# Patient Record
Sex: Female | Born: 1975 | Hispanic: No | Smoking: Current some day smoker
Health system: Southern US, Community
[De-identification: ages and names within clinical notes are randomized; demographics above are authoritative.]

## PROBLEM LIST (undated history)

## (undated) DIAGNOSIS — K219 Gastro-esophageal reflux disease without esophagitis: Secondary | ICD-10-CM

## (undated) HISTORY — PX: OTHER SURGICAL HISTORY: SHX169

## (undated) HISTORY — DX: Gastro-esophageal reflux disease without esophagitis: K21.9

## (undated) HISTORY — PX: WISDOM TOOTH EXTRACTION: SHX21

---

## 1997-03-01 ENCOUNTER — Encounter: Admission: RE | Admit: 1997-03-01 | Discharge: 1997-04-12 | Payer: Self-pay | Admitting: Obstetrics and Gynecology

## 1998-07-08 ENCOUNTER — Ambulatory Visit (HOSPITAL_COMMUNITY): Admission: RE | Admit: 1998-07-08 | Discharge: 1998-07-08 | Payer: Self-pay | Admitting: Obstetrics and Gynecology

## 1998-07-08 ENCOUNTER — Encounter: Payer: Self-pay | Admitting: Obstetrics and Gynecology

## 1998-12-09 ENCOUNTER — Encounter (HOSPITAL_COMMUNITY): Admission: RE | Admit: 1998-12-09 | Discharge: 1998-12-15 | Payer: Self-pay | Admitting: Obstetrics and Gynecology

## 1998-12-14 ENCOUNTER — Inpatient Hospital Stay (HOSPITAL_COMMUNITY): Admission: AD | Admit: 1998-12-14 | Discharge: 1998-12-16 | Payer: Self-pay | Admitting: Obstetrics and Gynecology

## 1999-09-27 ENCOUNTER — Other Ambulatory Visit: Admission: RE | Admit: 1999-09-27 | Discharge: 1999-09-27 | Payer: Self-pay | Admitting: Obstetrics and Gynecology

## 2001-06-10 ENCOUNTER — Other Ambulatory Visit: Admission: RE | Admit: 2001-06-10 | Discharge: 2001-06-10 | Payer: Self-pay | Admitting: Obstetrics and Gynecology

## 2002-09-16 ENCOUNTER — Other Ambulatory Visit: Admission: RE | Admit: 2002-09-16 | Discharge: 2002-09-16 | Payer: Self-pay | Admitting: Obstetrics and Gynecology

## 2003-10-13 ENCOUNTER — Other Ambulatory Visit: Admission: RE | Admit: 2003-10-13 | Discharge: 2003-10-13 | Payer: Self-pay | Admitting: Internal Medicine

## 2004-01-26 ENCOUNTER — Ambulatory Visit: Payer: Self-pay | Admitting: Internal Medicine

## 2004-03-06 ENCOUNTER — Ambulatory Visit: Payer: Self-pay | Admitting: Family Medicine

## 2004-10-18 ENCOUNTER — Other Ambulatory Visit: Admission: RE | Admit: 2004-10-18 | Discharge: 2004-10-18 | Payer: Self-pay | Admitting: Obstetrics and Gynecology

## 2005-04-11 ENCOUNTER — Ambulatory Visit: Payer: Self-pay | Admitting: Internal Medicine

## 2005-06-14 ENCOUNTER — Other Ambulatory Visit: Admission: RE | Admit: 2005-06-14 | Discharge: 2005-06-14 | Payer: Self-pay | Admitting: Obstetrics and Gynecology

## 2005-10-09 ENCOUNTER — Ambulatory Visit: Payer: Self-pay | Admitting: Internal Medicine

## 2006-08-30 ENCOUNTER — Ambulatory Visit: Payer: Self-pay | Admitting: Internal Medicine

## 2006-09-24 ENCOUNTER — Ambulatory Visit: Payer: Self-pay | Admitting: Internal Medicine

## 2006-09-24 DIAGNOSIS — R350 Frequency of micturition: Secondary | ICD-10-CM

## 2006-09-24 LAB — CONVERTED CEMR LAB
Bilirubin Urine: NEGATIVE
Glucose, Urine, Semiquant: NEGATIVE
Ketones, urine, test strip: NEGATIVE
Nitrite: POSITIVE
Protein, U semiquant: NEGATIVE
Specific Gravity, Urine: 1.01
Urobilinogen, UA: NEGATIVE
pH: 6.5

## 2006-10-01 ENCOUNTER — Encounter (INDEPENDENT_AMBULATORY_CARE_PROVIDER_SITE_OTHER): Payer: Self-pay | Admitting: *Deleted

## 2006-10-02 ENCOUNTER — Telehealth (INDEPENDENT_AMBULATORY_CARE_PROVIDER_SITE_OTHER): Payer: Self-pay | Admitting: *Deleted

## 2006-10-04 ENCOUNTER — Ambulatory Visit: Payer: Self-pay | Admitting: Internal Medicine

## 2006-10-08 ENCOUNTER — Encounter (INDEPENDENT_AMBULATORY_CARE_PROVIDER_SITE_OTHER): Payer: Self-pay | Admitting: *Deleted

## 2006-10-14 ENCOUNTER — Telehealth (INDEPENDENT_AMBULATORY_CARE_PROVIDER_SITE_OTHER): Payer: Self-pay | Admitting: *Deleted

## 2007-03-07 ENCOUNTER — Ambulatory Visit (HOSPITAL_COMMUNITY): Admission: RE | Admit: 2007-03-07 | Discharge: 2007-03-07 | Payer: Self-pay | Admitting: Obstetrics and Gynecology

## 2007-03-17 ENCOUNTER — Ambulatory Visit: Payer: Self-pay | Admitting: Internal Medicine

## 2007-03-17 DIAGNOSIS — J019 Acute sinusitis, unspecified: Secondary | ICD-10-CM

## 2007-04-15 ENCOUNTER — Telehealth (INDEPENDENT_AMBULATORY_CARE_PROVIDER_SITE_OTHER): Payer: Self-pay | Admitting: *Deleted

## 2007-04-16 ENCOUNTER — Ambulatory Visit: Payer: Self-pay | Admitting: Internal Medicine

## 2007-04-16 DIAGNOSIS — R498 Other voice and resonance disorders: Secondary | ICD-10-CM

## 2007-04-16 LAB — CONVERTED CEMR LAB: Rapid Strep: NEGATIVE

## 2007-04-19 LAB — CONVERTED CEMR LAB
Basophils Absolute: 0.1 10*3/uL (ref 0.0–0.1)
Basophils Relative: 1.4 % — ABNORMAL HIGH (ref 0.0–1.0)
Eosinophils Absolute: 0.1 10*3/uL (ref 0.0–0.6)
Eosinophils Relative: 1.9 % (ref 0.0–5.0)
Free T4: 0.9 ng/dL (ref 0.6–1.6)
HCT: 39.1 % (ref 36.0–46.0)
Hemoglobin: 12.8 g/dL (ref 12.0–15.0)
Lymphocytes Relative: 29.1 % (ref 12.0–46.0)
MCHC: 32.7 g/dL (ref 30.0–36.0)
MCV: 89.3 fL (ref 78.0–100.0)
Monocytes Absolute: 0.7 10*3/uL (ref 0.2–0.7)
Monocytes Relative: 14.7 % — ABNORMAL HIGH (ref 3.0–11.0)
Neutro Abs: 2.5 10*3/uL (ref 1.4–7.7)
Neutrophils Relative %: 52.9 % (ref 43.0–77.0)
Platelets: 224 10*3/uL (ref 150–400)
RBC: 4.38 M/uL (ref 3.87–5.11)
RDW: 14.2 % (ref 11.5–14.6)
TSH: 1.08 microintl units/mL (ref 0.35–5.50)
WBC: 4.8 10*3/uL (ref 4.5–10.5)

## 2007-04-21 ENCOUNTER — Encounter (INDEPENDENT_AMBULATORY_CARE_PROVIDER_SITE_OTHER): Payer: Self-pay | Admitting: *Deleted

## 2007-04-24 ENCOUNTER — Encounter (INDEPENDENT_AMBULATORY_CARE_PROVIDER_SITE_OTHER): Payer: Self-pay | Admitting: *Deleted

## 2008-01-27 ENCOUNTER — Ambulatory Visit: Payer: Self-pay | Admitting: Family Medicine

## 2008-01-27 DIAGNOSIS — J069 Acute upper respiratory infection, unspecified: Secondary | ICD-10-CM | POA: Insufficient documentation

## 2008-01-27 LAB — CONVERTED CEMR LAB: Rapid Strep: NEGATIVE

## 2008-04-04 ENCOUNTER — Telehealth: Payer: Self-pay | Admitting: Internal Medicine

## 2008-08-02 IMAGING — RF DG HYSTEROGRAM
7 series · 7 of 7 positions shown · IV contrast (omnipaque)
Comparison: none

CLINICAL DATA: Status post Essure procedure for contraception.  Evaluate tubal patency.
 HYSTEROSALPINGOGRAM:
TECHNIQUE: Following cleansing of the cervix and vagina with Betadine, a hysterosalpingogram catheter was placed into the endocervical canal.  Hysterosalpingogram was then performed using Omnipaque 300 contrast.  The patient tolerated the examination without difficulty.

[Series 1: run · 1 of 1 slices shown (1 of 7)]
[im 1/1]
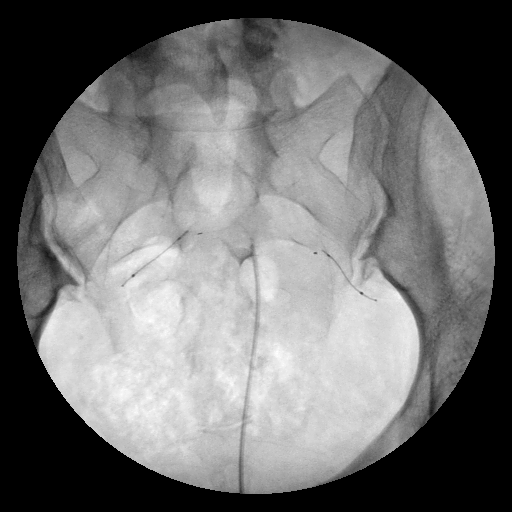

[Series 2: run · 1 of 1 slices shown (2 of 7)]
[im 1/1]
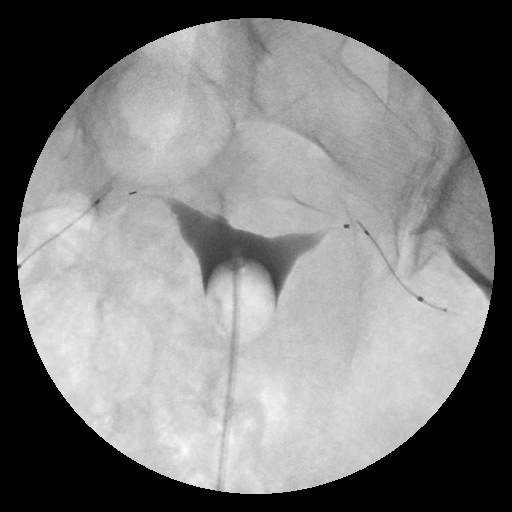

[Series 3: run · 1 of 1 slices shown (3 of 7)]
[im 1/1]
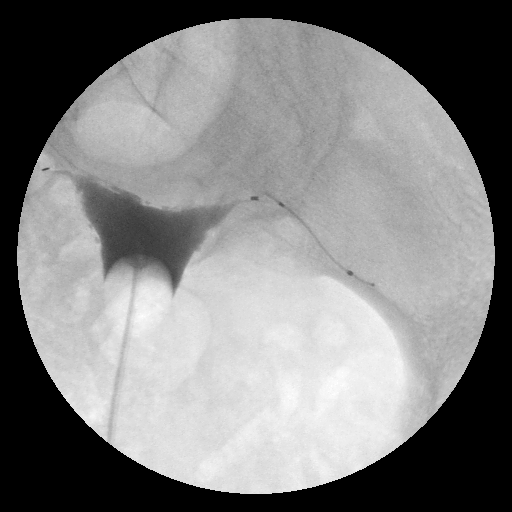

[Series 4: run · 1 of 1 slices shown (4 of 7)]
[im 1/1]
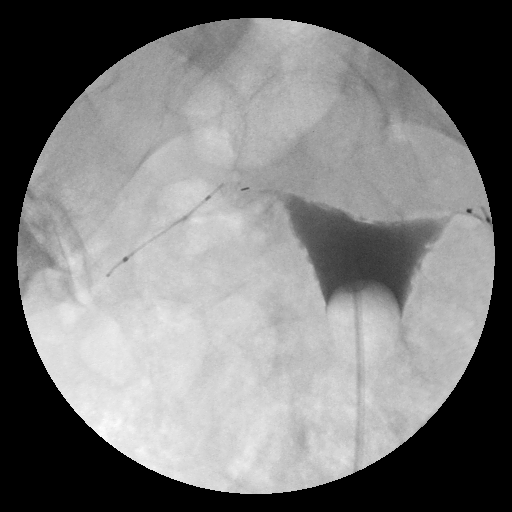

[Series 5: run · 1 of 1 slices shown (5 of 7)]
[im 1/1]
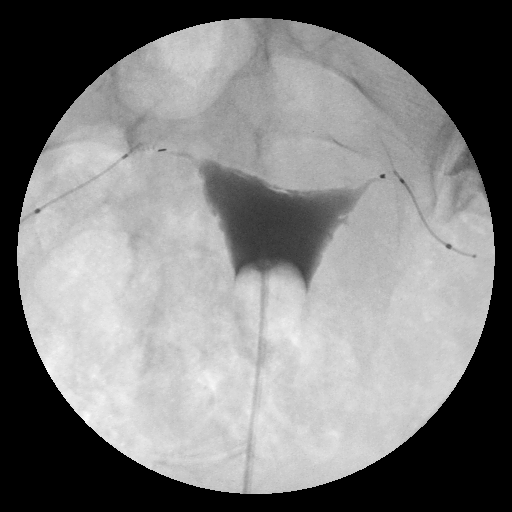

[Series 6: run · 1 of 1 slices shown (6 of 7)]
[im 1/1]
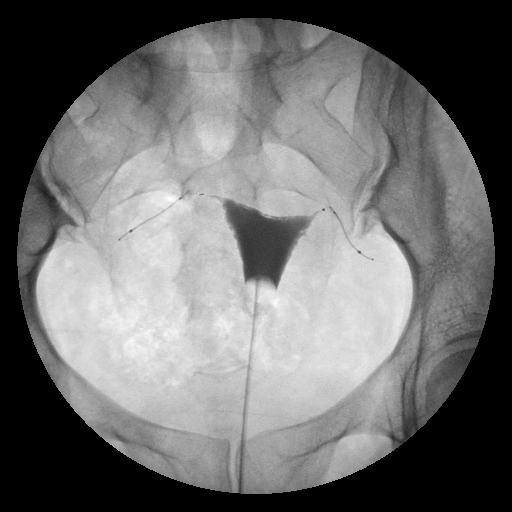

[Series 7: run · 1 of 1 slices shown (7 of 7)]
[im 1/1]
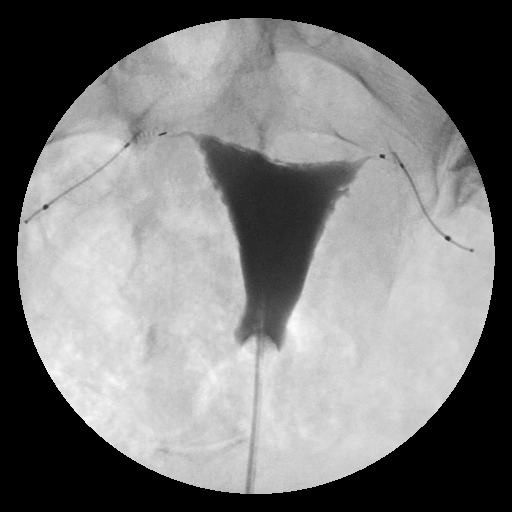

[7 of 7 positions shown; findings below may reference images not displayed]

FINDINGS: The endometrial cavity of the uterus is normal in appearance.
 Both Essure micro-inserts are in satisfactory location.   
 Both fallopian tubes are occluded at the cornua (category I).
IMPRESSION: Both Essure micro-inserts are in satisfactory location.   Both fallopian tubes are occluded at the cornua (category I).

## 2009-08-09 ENCOUNTER — Ambulatory Visit: Payer: Self-pay | Admitting: Internal Medicine

## 2009-08-09 DIAGNOSIS — K219 Gastro-esophageal reflux disease without esophagitis: Secondary | ICD-10-CM

## 2009-10-05 ENCOUNTER — Ambulatory Visit: Payer: Self-pay | Admitting: Internal Medicine

## 2009-10-05 LAB — CONVERTED CEMR LAB: Rapid Strep: NEGATIVE

## 2010-02-16 ENCOUNTER — Encounter (INDEPENDENT_AMBULATORY_CARE_PROVIDER_SITE_OTHER): Payer: Self-pay | Admitting: *Deleted

## 2010-02-16 ENCOUNTER — Telehealth (INDEPENDENT_AMBULATORY_CARE_PROVIDER_SITE_OTHER): Payer: Self-pay | Admitting: *Deleted

## 2010-02-19 ENCOUNTER — Encounter: Payer: Self-pay | Admitting: Obstetrics and Gynecology

## 2010-02-28 NOTE — Assessment & Plan Note (Signed)
Summary: ACID REFLUX/KN   Vital Signs:  Patient profile:   35 year old female Weight:      148.2 pounds Temp:     98.0 degrees F oral Pulse rate:   72 / minute Resp:     16 per minute BP sitting:   102 / 68  (left arm) Cuff size:   large  Vitals Entered By: Shonna Chock (August 09, 2009 10:55 AM) CC: ? acid reflux: Patient with burning sensation after eating, gets full reall qiuck and burping Comments REVIEWED MED LIST, PATIENT AGREED DOSE AND INSTRUCTION CORRECT    Primary Care Provider:  Marga Melnick MD  CC:  ? acid reflux: Patient with burning sensation after eating and gets full reall qiuck and burping.  History of Present Illness: Several months of dry heaving in am; poor nutrition, ie no breakfast but 40 oz of coffee / day & cokes. Smoking 1 pp week.No Rx to date.  Allergies (verified): No Known Drug Allergies  Review of Systems General:  Weight up 20# / 2 years. ENT:  Denies difficulty swallowing and hoarseness. GI:  Complains of indigestion, loss of appetite, and nausea; denies abdominal pain, bloody stools, and dark tarry stools.  Physical Exam  General:  well-nourished,in no acute distress; alert,appropriate and cooperative throughout examination Eyes:  No corneal or conjunctival inflammation noted. No icterus Mouth:  Oral mucosa and oropharynx without lesions or exudates.  Teeth in good repair.No pharyngeal erythema.   Lungs:  Normal respiratory effort, chest expands symmetrically. Lungs are clear to auscultation, no crackles or wheezes. Heart:  Normal rate and regular rhythm. S1 and S2 normal without gallop, murmur, click, rub or other extra sounds. Skin:  Intact without suspicious lesions or rashes. No jaundice Cervical Nodes:  No lymphadenopathy noted Axillary Nodes:  No palpable lymphadenopathy Psych:  memory intact for recent and remote, normally interactive, and good eye contact.     Impression & Recommendations:  Problem # 1:  GERD (ICD-530.81)  Her  updated medication list for this problem includes:    Omeprazole 20 Mg Cpdr (Omeprazole) .Marland Kitchen... 1 two times a day x 46month then 1 once daily pre b'fast  Complete Medication List: 1)  Mucinex  2)  Smz-tmp Ds 800-160 Mg Tabs (Sulfamethoxazole-trimethoprim) .Marland Kitchen.. 1 two times a day with 8 oz water 3)  Fluconazole 150 Mg Tabs (Fluconazole) .Marland Kitchen.. 1 once daily prn 4)  Azithromycin 250 Mg Tabs (Azithromycin) .... As per pack 5)  Asmanex 120 Metered Doses 220 Mcg/inh Aepb (Mometasone furoate) .... 2 inhalations q 12 hrs 6)  Clarinex 5 Mg Tabs (Desloratadine) .Marland Kitchen.. 1 by mouth once daily 7)  Omeprazole 20 Mg Cpdr (Omeprazole) .Marland Kitchen.. 1 two times a day x 46month then 1 once daily pre b'fast  Patient Instructions: 1)  Consume < 30 grams of sugar/ day from foods & drinks with High Fructose Corn Syrup  as #1,2 , or #3  on label.Avoid foods high in acid (tomatoes, citrus juices, spicy foods). Avoid eating within two hours of lying down or before exercising. Do not over eat; try smaller more frequent meals. Elevate head of bed twelve inches when sleeping. Prescriptions: OMEPRAZOLE 20 MG CPDR (OMEPRAZOLE) 1 two times a day X 46month then 1 once daily pre b'fast  #60 x 1   Entered and Authorized by:   Marga Melnick MD   Signed by:   Marga Melnick MD on 08/09/2009   Method used:   Print then Give to Patient   RxID:  [id number]  

## 2010-02-28 NOTE — Assessment & Plan Note (Signed)
Summary: sore throat./cbs   Vital Signs:  Patient profile:   35 year old female Weight:      154.2 pounds Temp:     98.5 degrees F oral Pulse rate:   72 / minute Resp:     16 per minute BP sitting:   120 / 78  (left arm) Cuff size:   large  Vitals Entered By: Shonna Chock CMA (October 05, 2009 3:18 PM) CC: Sore throat and head hurts, URI symptoms   Primary Care Provider:  Marga Melnick MD  CC:  Sore throat and head hurts and URI symptoms.  History of Present Illness:  URI Symptoms      This is a 35 year old woman who presents with URI symptoms since last night  The patient reports nasal congestion, purulent nasal discharge, and sore throat, but denies productive cough and earache.  The patient denies fever, dyspnea, and wheezing.  The patient also reports frontal  headache.  Risk factors for Strep sinusitis include  facial pain and tender adenopathy.  The patient denies the following risk factors for Strep sinusitis: tooth pain.  Rx: NSAIDS, tea.Smoking 4 cig/day.  Current Medications (verified): 1)  Omeprazole 20 Mg Cpdr (Omeprazole) .Marland Kitchen.. 1 Two Times A Day X 25month Then 1 Once Daily Pre B'fast  Allergies (verified): No Known Drug Allergies  Physical Exam  General:  well-nourished,in no acute distress; alert,appropriate and cooperative throughout examination Ears:  External ear exam shows no significant lesions or deformities.  Otoscopic examination reveals clear canals, tympanic membranes are intact bilaterally without bulging, retraction, inflammation or discharge. Hearing is grossly normal bilaterally. Nose:  External nasal examination shows no deformity or inflammation. Nasal mucosa are  mildly erythematous.Septal dislocation & deviation Mouth:  Oral mucosa and oropharynx without lesions or exudates.  Teeth in good repair. Lungs:  Normal respiratory effort, chest expands symmetrically. Lungs are clear to auscultation, no crackles or wheezes. Heart:  Normal rate and  regular rhythm. S1 and S2 normal without gallop, murmur, click, rub . Cervical Nodes:  L anterior LN tender.   Axillary Nodes:  No palpable lymphadenopathy   Impression & Recommendations:  Problem # 1:  SINUSITIS- ACUTE-NOS (ICD-461.9)  The following medications were removed from the medication list:    Smz-tmp Ds 800-160 Mg Tabs (Sulfamethoxazole-trimethoprim) .Marland Kitchen... 1 two times a day with 8 oz water    Azithromycin 250 Mg Tabs (Azithromycin) .Marland Kitchen... As per pack Her updated medication list for this problem includes:    Amoxicillin 500 Mg Caps (Amoxicillin) .Marland Kitchen... 1 three times a day  Complete Medication List: 1)  Omeprazole 20 Mg Cpdr (Omeprazole) .Marland Kitchen.. 1 two times a day x 25month then 1 once daily pre b'fast 2)  Amoxicillin 500 Mg Caps (Amoxicillin) .Marland Kitchen.. 1 three times a day 3)  Lorazepam 0.5 Mg Tabs (Lorazepam) .Marland Kitchen.. 1 every 8-12 hrs as needed  Other Orders: Rapid Strep ([id number])  Patient Instructions: 1)  Neti pot once daily until sinuses are clear.Plain Mucinex as needed if secretions are thick.Drink as much NON dairy  fluid as you can tolerate for the next few days. Prescriptions: LORAZEPAM 0.5 MG TABS (LORAZEPAM) 1 every 8-12 hrs as needed  #30 x 1   Entered and Authorized by:   Marga Melnick MD   Signed by:   Marga Melnick MD on 10/05/2009   Method used:   Print then Give to Patient   RxID:   [id number] AMOXICILLIN 500 MG CAPS (AMOXICILLIN) 1 three times a day  #30  x 0   Entered and Authorized by:   Marga Melnick MD   Signed by:   Marga Melnick MD on 10/05/2009   Method used:   Faxed to ...       [facility] [address] (retail)       [address]       [place], [place]  [address]       Ph: [phone] or [phone]       Fax: [phone]   RxID:   [id number]   Laboratory Results    Other Tests  Rapid Strep: negative

## 2010-03-02 NOTE — Progress Notes (Signed)
Summary: Refill Request  Phone Note Refill Request Message from:  Pharmacy on February 16, 2010 9:49 AM  Refills Requested: Medication #1:  LORAZEPAM 0.5 MG TABS 1 every 8-12 hrs as needed.   Dosage confirmed as above?Dosage Confirmed   Supply Requested: 30   [facility] [address]  Next Appointment Scheduled: none Initial call taken by: Harold Barban,  February 16, 2010 9:49 AM    Prescriptions: LORAZEPAM 0.5 MG TABS (LORAZEPAM) 1 every 8-12 hrs as needed  #30 x 0   Entered by:   Shonna Chock CMA   Authorized by:   Marga Melnick MD   Signed by:   Shonna Chock CMA on 02/16/2010   Method used:   Printed then faxed to ...       [facility] [address] (retail)       [address]       [place], [place]  [address]       Ph: [phone] or [phone]       Fax: [phone]   RxID:   [id number]  Patient needs to schedule a yearly follow-up

## 2010-03-02 NOTE — Letter (Signed)
Summary: Primary Care Appointment Letter  [facility]  [address] [place], [place] [address]   Phone: [phone]  Fax: [phone]    02/16/2010 MRN: [id number]  [facility] [address] [place], [place]  [address]  Dear Ms. Christy Anderson,   Your Primary Care Physician Marga Melnick MD has indicated that:    ____X___it is time to schedule an appointment (It is time for a yearly physical, this is necessary to continue refilling meds).    _______you missed your appointment on______ and need to call and          reschedule.    _______you need to have lab work done.    _______you need to schedule an appointment discuss lab or test results.    _______you need to call to reschedule your appointment that is                       scheduled on _________.     Please call our office as soon as possible. Our phone number is [phone]. Please press option 1. Our office is open 8a-5p, Monday through Friday.     Thank you,    [facility]

## 2010-04-03 ENCOUNTER — Telehealth: Payer: Self-pay | Admitting: Internal Medicine

## 2010-04-11 NOTE — Progress Notes (Signed)
Summary: letter for unemployment  Phone Note Call from Patient Call back at Work Phone [phone]   Caller: Patient Summary of Call: Pt says that she quit her job about 6 wks ago and would like to have letter written so she can have documentation that she was taking medication for stress. Since her unemployment was denied she will need to have letter written  Follow-up for Phone Call        To Who t May Concern: Phineas Douglas  has been on medication prescrbed  for stress related symptoms. Douglass Rivers MD Follow-up by: Marga Melnick MD,  April 03, 2010 4:23 PM  Additional Follow-up for Phone Call Additional follow up Details #1::        Letter typed and printed on letterhead. Additional Follow-up by: Barnie Mort,  April 05, 2010 1:00 PM    Additional Follow-up for Phone Call Additional follow up Details #2::    Left Pt detail message letter typed and ready for pick-up. Letter placed up front. Pt to call if she has any other concerns.............Marland KitchenFelecia Deloach CMA  April 05, 2010 1:07 PM

## 2010-06-05 ENCOUNTER — Telehealth: Payer: Self-pay | Admitting: *Deleted

## 2010-06-05 NOTE — Telephone Encounter (Signed)
Pt called would like to have letter stating date for which she was omeprazole for acid reflux which was 07/2009 and would like for letter to emailed to her.   Hop letter ok?

## 2010-06-05 NOTE — Telephone Encounter (Signed)
To Whom It May Concern: Omeprazole, a protein pump inhibitor, as initially prescribed for MS. Nolon Rod and July of 2011.  Douglass Rivers MD, FACP

## 2010-06-06 NOTE — Telephone Encounter (Signed)
Left msg for pt on voicemail letter completed.

## 2010-06-06 NOTE — Telephone Encounter (Deleted)
To Whom It May Concern: Omeprazole, a protein pump inhibitor, as initially prescribed for MS. Kynsley Jirak and July of 2011.  W. F. Hopper MD, FACP 

## 2010-08-22 ENCOUNTER — Telehealth: Payer: Self-pay | Admitting: Internal Medicine

## 2010-08-23 ENCOUNTER — Ambulatory Visit (INDEPENDENT_AMBULATORY_CARE_PROVIDER_SITE_OTHER): Payer: Self-pay | Admitting: Internal Medicine

## 2010-08-23 ENCOUNTER — Encounter: Payer: Self-pay | Admitting: Internal Medicine

## 2010-08-23 VITALS — BP 112/76 | HR 75 | Wt 144.8 lb

## 2010-08-23 DIAGNOSIS — R4589 Other symptoms and signs involving emotional state: Secondary | ICD-10-CM

## 2010-08-23 DIAGNOSIS — F411 Generalized anxiety disorder: Secondary | ICD-10-CM

## 2010-08-23 NOTE — Telephone Encounter (Signed)
I called patient (Per Dr. Frederik Pear Request) to find out details of call for Dr.Hopper had not recently spoke with patient.  Patient indicated she needs Dr.Hopper to write her a letter "Its kinda personal", schedule patient an appointment to see Dr.Hopper today

## 2010-08-23 NOTE — Patient Instructions (Signed)
If your appeal were denied, unfortunately a formal psychological evaluation may be required. Medically having seen you  for almost 2 decades, I do not believe that is necessary.

## 2010-08-23 NOTE — Progress Notes (Signed)
  Subjective:    Patient ID: Christy Anderson, female    DOB: Jan 29, 1976, 35 y.o.   MRN: [id number]  HPI She left her place of employment in mid-January; her unemployment benefits have been denied as of this date.  She worked at her place of employment for 4 years and had received exemplary job performance reviews. Financially  she  also performed an extremely high level.  The major issue was the stress in the workplace, particularly suboptimal interpersonal relationships with  supervisors.  She had taken alprazolam and subsequently lorazepam without significant  benefit. A significant stress for her was having take medicine to work in this environment.  She has had 3 children and going to divorce but has never had to take medications for anxiety or stress prior to employment at this location.    Review of Systems     Objective:   Physical Exam She appears healthy and well-nourished. Vital signs are stable and normal as documented.  She is oriented x3. She exhibits good logic and flow of ideas. Mini  mental status exam is normal to recall & problem solving. Cognition and judgment appear intact. Alert, communicative  and cooperative with normal attention span and concentration. No apparent delusions, illusions, hallucinations. Intermittently she becomes tearful as she discusses the stresses she has experienced in the workplace.          Assessment & Plan:  #1 anxiety, situational. The prior job situation was not a mentally and emotionally healthy environment for her despite the performance ratings & financial rewards she experienced. She is very intelligent and capable as those performance ratings have documented. In a nontoxic environment she should excel without need for medication

## 2012-05-08 ENCOUNTER — Encounter: Payer: Self-pay | Admitting: Internal Medicine

## 2012-05-20 ENCOUNTER — Encounter: Payer: Self-pay | Admitting: Internal Medicine

## 2012-05-20 ENCOUNTER — Ambulatory Visit (INDEPENDENT_AMBULATORY_CARE_PROVIDER_SITE_OTHER): Payer: Self-pay | Admitting: Internal Medicine

## 2012-05-20 VITALS — BP 110/78 | HR 106 | Temp 100.6°F | Wt 149.0 lb

## 2012-05-20 DIAGNOSIS — T3695XA Adverse effect of unspecified systemic antibiotic, initial encounter: Secondary | ICD-10-CM

## 2012-05-20 DIAGNOSIS — J020 Streptococcal pharyngitis: Secondary | ICD-10-CM

## 2012-05-20 DIAGNOSIS — B49 Unspecified mycosis: Secondary | ICD-10-CM

## 2012-05-20 MED ORDER — FLUCONAZOLE 150 MG PO TABS: 150.0000 mg | ORAL_TABLET | Freq: Once | ORAL | Status: DC

## 2012-05-20 MED ORDER — AMOXICILLIN 500 MG PO CAPS: 500.0000 mg | ORAL_CAPSULE | Freq: Three times a day (TID) | ORAL | Status: DC

## 2012-05-20 NOTE — Patient Instructions (Signed)

## 2012-05-21 ENCOUNTER — Encounter: Payer: Self-pay | Admitting: Internal Medicine

## 2012-05-21 NOTE — Addendum Note (Signed)
Addended by: Carin Primrose on: 05/21/2012 03:53 PM   Modules accepted: Orders

## 2012-05-21 NOTE — Progress Notes (Signed)
HPI  Pt presents to the clinic today with c/o sore throat x 4 days. She has also has fever and fatigue. She looked at her throat in the mirror and saw white patches in her throat. She has had strep throat in the past. She reports this feels the same. She has taken tylenol for the fever but not anything else. She has had sick contacts.  Review of Systems     History reviewed. No pertinent past medical history.  No family history on file.  History   Social History  . Marital Status: Divorced    Spouse Name: N/A    Number of Children: N/A  . Years of Education: N/A   Occupational History  . Not on file.   Social History Main Topics  . Smoking status: Current Some Day Smoker  . Smokeless tobacco: Not on file  . Alcohol Use: Yes  . Drug Use: No  . Sexually Active: Not on file   Other Topics Concern  . Not on file   Social History Narrative  . No narrative on file    No Known Allergies   Constitutional: Positive headache, fatigue and fever. Denies abrupt weight changes.  HEENT:  Positive sore throat. Denies eye redness, eye pain, pressure behind the eyes, facial pain, nasal congestion, ear pain, ringing in the ears, wax buildup, runny nose or bloody nose. Respiratory: Denies cough, difficulty breathing or shortness of breath.  Cardiovascular: Denies chest pain, chest tightness, palpitations or swelling in the hands or feet.   No other specific complaints in a complete review of systems (except as listed in HPI above).  Objective:   BP 110/78  Pulse 106  Temp(Src) 100.6 F (38.1 C) (Oral)  Wt 149 lb (67.586 kg)  SpO2 96%  LMP 05/03/2012 Wt Readings from Last 3 Encounters:  05/20/12 149 lb (67.586 kg)  08/23/10 144 lb 12.8 oz (65.681 kg)  10/05/09 154 lb 3.2 oz (69.945 kg)     General: Appears her stated age, well developed, well nourished in NAD. HEENT: Head: normal shape and size; Eyes: sclera white, no icterus, conjunctiva pink, PERRLA and EOMs intact; Ears:  Tm's gray and intact, normal light reflex; Nose: mucosa pink and moist, septum midline; Throat/Mouth:. Teeth present, mucosa erythematous and moist, moderate exudate noted on the right tonsillar pillar, no lesions or ulcerations noted.  Neck: Moderate tonsillar lymphadenopathy. Neck supple, trachea midline. No massses, lumps or thyromegaly present.  Cardiovascular: Normal rate and rhythm. S1,S2 noted.  No murmur, rubs or gallops noted. No JVD or BLE edema. No carotid bruits noted. Pulmonary/Chest: Normal effort and positive vesicular breath sounds. No respiratory distress. No wheezes, rales or ronchi noted.      Assessment & Plan:   Strep throat, new onset:  RST and throat culture Get some rest and drink plenty of water Do salt water gargles for the sore throat eRx for Amoxicillin x 10 days Tylenol as needed for fever/pain Will give Diflucan for antibiotic induced yeast infection.  RTC as needed or if symptoms persist.

## 2012-07-23 ENCOUNTER — Encounter: Payer: Self-pay | Admitting: Internal Medicine

## 2012-08-29 ENCOUNTER — Encounter: Payer: Self-pay | Admitting: Internal Medicine

## 2012-09-16 ENCOUNTER — Encounter: Payer: Self-pay | Admitting: Internal Medicine

## 2012-09-16 ENCOUNTER — Ambulatory Visit (INDEPENDENT_AMBULATORY_CARE_PROVIDER_SITE_OTHER): Payer: BC Managed Care – PPO | Admitting: Internal Medicine

## 2012-09-16 VITALS — BP 90/60 | HR 81 | Temp 98.2°F | Ht 61.25 in | Wt 146.0 lb

## 2012-09-16 DIAGNOSIS — J0100 Acute maxillary sinusitis, unspecified: Secondary | ICD-10-CM

## 2012-09-16 DIAGNOSIS — J020 Streptococcal pharyngitis: Secondary | ICD-10-CM

## 2012-09-16 NOTE — Patient Instructions (Addendum)
Zicam Melts or Zinc lozenges as per package label for scratchy throat . Complementary options include  vitamin C 2000 mg daily; & Echinacea for 4-7 days. Report persistent or progressive fever; discolored nasal or chest secretions; or frontal headache or facial  pain. Fill the  prescription for antibioticif it there is not dramatic improvement in the next 48 hours.     Please use a water pik to wash out the retained bodies in the tonsil. If these are left, they  will cause inflammation with swelling and pain. Plain Mucinex (NOT D) for thick secretions ;force NON dairy fluids .   Nasal cleansing in the shower as discussed with lather of mild shampoo.After 10 seconds wash off lather while  exhaling through nostrils. Make sure that all residual soap is removed to prevent irritation.  Use a Neti pot daily only  as needed for significant sinus congestion; going from open side to congested side . Plain Allegra (NOT D )  160 daily , Loratidine 10 mg , OR Zyrtec 10 mg @ bedtime  as needed for itchy eyes & sneezing.

## 2012-09-16 NOTE — Addendum Note (Signed)
Addended by: Verdie Shire on: 09/16/2012 01:54 PM   Modules accepted: Orders

## 2012-09-16 NOTE — Progress Notes (Signed)
  Subjective:    Patient ID: Christy Anderson, female    DOB: 1976/01/20, 37 y.o.   MRN: [id number]  HPI   Symptoms began 09/13/12 as rhinitis and sinus pressure. She had production of a yellow bloody mucoid blood from the left nare. She's continued to have fatigue over the last 3 days as well as some swimmy headedness and dizziness. She's noted a white patch of the right posterior pharynx.  She's had minor itching of the eyes.  She describes significant right frontal headache and some minor sore throat.  She is concerned as she was exposed to a client with viral meningitis last week  Treatments consisted only of  complementary tea      Review of Systems  She specifically denies significant sneezing or other extrinsic symptoms. She also has had no fever, chills, or sweats  There has been no associated cough, sputum production, shortness of breath, or wheezing. She has no dental pain, otic pain, otic discharge.     Objective:   Physical Exam General appearance:good health ;well nourished; no acute distress or increased work of breathing is present.  No  lymphadenopathy about the head, neck, or axilla noted.   Eyes: No conjunctival inflammation or lid edema is present.   Ears:  External ear exam shows no significant lesions or deformities.  Otoscopic examination reveals clear canals, tympanic membranes are intact bilaterally without bulging, retraction, inflammation or discharge.  Nose:  External nasal examination shows no deformity or inflammation. Nasal mucosa are erythematous but moist without lesions or exudates. No septal dislocation or deviation.No obstruction to airflow.   Oral exam: Dental hygiene is good; lips and gums are healthy appearing.There is no oropharyngeal erythema or exudate noted. She has small retention body in the right tonsil  Neck:  No deformities,, masses, or tenderness noted.   Supple with full range of motion without pain. No signs of meningismus with  passive neck flexion or straight leg raising to 90. Heart:  Normal rate and regular rhythm. S1 and S2 normal without gallop, murmur, click, rub or other extra sounds.   Lungs:Chest clear to auscultation; no wheezes, rhonchi,rales ,or rubs present.No increased work of breathing.    Extremities:  No cyanosis, edema, or clubbing  noted    Skin: Warm & dry          Assessment & Plan:  #1 right maxillary sinusitis. Concretion right tonsil without evidence of exudate  Plan see orders

## 2012-10-06 ENCOUNTER — Telehealth: Payer: Self-pay | Admitting: Internal Medicine

## 2012-10-06 NOTE — Telephone Encounter (Signed)
Patient is calling requesting the antibiotics for her sinus symptoms. [facility] [address]

## 2012-10-07 NOTE — Telephone Encounter (Signed)
LM @ (5:52pm) asking the pt to RTC regarding note requesting ATB.//AB/CMA

## 2012-10-07 NOTE — Telephone Encounter (Signed)
Pt requesting ATB that was given to her when she was seen on (09-16-12).  Did not see that a ATB was call-in for her on that date.  Please advise.//AB/CMA

## 2012-10-07 NOTE — Telephone Encounter (Signed)
The standard of care is that antibiotics are not prescribed without an office visit.This policy is to help prevent serious health threatening infections with antibiotic resistant organisms or complications such as antibiotic-induced colitis. 

## 2012-10-08 NOTE — Telephone Encounter (Signed)
Spoke with the pt and asked if she wanted to make an appt to be seen, and the pt stated that she did not want to schedule an appt.  Informed her she will need an appt in order to be prescribed an ATB.  Pt stated that's okay.//AB/CMA

## 2012-10-08 NOTE — Telephone Encounter (Signed)
Spoke with pt today. Pt states that at the appt on 8-19 she was given the option of antibiotics. Pt denied at that time. States that she is still having the same symptoms. Pt was offered an appt today with Dr. Drue Novel but she stated she is not coming into the office for an appt when she was just seen for this condition. Then pt hung up.

## 2012-10-17 ENCOUNTER — Ambulatory Visit (INDEPENDENT_AMBULATORY_CARE_PROVIDER_SITE_OTHER): Payer: BC Managed Care – PPO | Admitting: Internal Medicine

## 2012-10-17 ENCOUNTER — Other Ambulatory Visit: Payer: Self-pay | Admitting: Internal Medicine

## 2012-10-17 ENCOUNTER — Encounter: Payer: Self-pay | Admitting: Internal Medicine

## 2012-10-17 VITALS — BP 100/67 | HR 86 | Temp 98.5°F | Resp 12 | Ht 61.25 in | Wt 145.4 lb

## 2012-10-17 DIAGNOSIS — Z1331 Encounter for screening for depression: Secondary | ICD-10-CM

## 2012-10-17 DIAGNOSIS — Z0000 Encounter for general adult medical examination without abnormal findings: Secondary | ICD-10-CM

## 2012-10-17 LAB — CBC WITH DIFFERENTIAL/PLATELET
Basophils Relative: 0.8 % (ref 0.0–3.0)
Eosinophils Absolute: 0.3 10*3/uL (ref 0.0–0.7)
Hemoglobin: 12.4 g/dL (ref 12.0–15.0)
Lymphs Abs: 2.8 10*3/uL (ref 0.7–4.0)
MCHC: 33.8 g/dL (ref 30.0–36.0)
MCV: 88.6 fl (ref 78.0–100.0)
Monocytes Absolute: 0.6 10*3/uL (ref 0.1–1.0)
Neutro Abs: 5.3 10*3/uL (ref 1.4–7.7)
RBC: 4.13 Mil/uL (ref 3.87–5.11)
RDW: 14 % (ref 11.5–14.6)

## 2012-10-17 NOTE — Patient Instructions (Addendum)
Preventive Health Care: Exercise  30-45  minutes a day, 3-4 days a week. Stretch aerobics or yoga are especially healthy options. Eat a low-fat diet with lots of fruits and vegetables, up to 7-9 servings per day. This would eliminate need for vitamin supplements for most individuals. Consume less than 30 grams of sugar per day from foods & drinks with High Fructose Corn Syrup as #2,3 or #4 on label.  If you activate the  My Chart system; lab & Xray results will be released directly  to you as soon as I review & address these through the computer. If you choose not to sign up for My Chart within 36 hours of labs being drawn; results will be reviewed & interpretation added before being copied & mailed, causing a delay in getting the results to you.If you do not receive that report within 7-10 days ,please call. Additionally you can use this system to gain direct  access to your records  if  out of town or @ an office of a  physician who is not in  the My Chart network.  This improves continuity of care & places you in control of your medical record.

## 2012-10-17 NOTE — Progress Notes (Signed)
  Subjective:    Patient ID: Christy Anderson, female    DOB: 10-Mar-1975, 37 y.o.   MRN: [id number]  HPI  She is here for a physical;acute issues include myalgias which she attributes to being "acidic" in context of smoking & drinking coffee.     Review of Systems  She is on a modified heart healthy diet; she is not exercising .She denies chest pain, palpitations, dyspnea, or claudication.  Family history is negative for premature coronary disease .Her myalgias is diffuse & not related to exercise. .      Objective:   Physical Exam  Gen.: Healthy and well-nourished in appearance. Alert, appropriate and cooperative throughout exam.Appears younger than stated age  Head: Normocephalic without obvious abnormalities  Eyes: No corneal or conjunctival inflammation noted. Pupils equal round reactive to light and accommodation.  Extraocular motion intact. Vision grossly normal without lenses Ears: External  ear exam reveals no significant lesions or deformities. Canals clear .TMs normal. Hearing is grossly normal bilaterally. Nose: External nasal exam reveals no deformity or inflammation. Nasal mucosa are pink and moist. No lesions or exudates noted.  Mouth: Oral mucosa and oropharynx reveal no lesions or exudates. Teeth in good repair. Neck: No deformities, masses, or tenderness noted. Range of motion slightly decreased. Thyroid normal. Lungs: Normal respiratory effort; chest expands symmetrically. Lungs are clear to auscultation without rales, wheezes, or increased work of breathing. Heart: Normal rate and rhythm. Normal S1 and S2. No gallop, click, or rub. S4 w/o murmur. Abdomen: Bowel sounds normal; abdomen soft and nontender. No masses, organomegaly or hernias noted. Genitalia: As per Gyn                                  Musculoskeletal/extremities: No deformity or scoliosis noted of  the thoracic or lumbar spine.  No clubbing, cyanosis, edema, or significant extremity  deformity noted. Range of  motion normal .Tone & strength  Normal. Joints normal . Nail health good. Able to lie down & sit up w/o help. Negative SLR bilaterally Vascular: Carotid, radial artery, dorsalis pedis and  posterior tibial pulses are full and equal. No bruits present. Neurologic: Alert and oriented x3. Deep tendon reflexes symmetrical and normal.         Skin: Intact without suspicious lesions or rashes. Lymph: No cervical, axillary lymphadenopathy present. Psych: Mood and affect are normal. Normally interactive                                                                                        Assessment & Plan:  #1 comprehensive physical exam; no acute findings  Plan: see Orders  & Recommendations

## 2012-10-21 ENCOUNTER — Encounter: Payer: Self-pay | Admitting: *Deleted

## 2012-10-21 LAB — VITAMIN D 1,25 DIHYDROXY
Vitamin D 1, 25 (OH)2 Total: 39 pg/mL (ref 18–72)
Vitamin D3 1, 25 (OH)2: 39 pg/mL

## 2012-10-22 LAB — LIPID PANEL
Cholesterol: 173 mg/dL (ref 0–200)
Triglycerides: 91 mg/dL (ref <150)

## 2012-10-22 LAB — HEPATIC FUNCTION PANEL
ALT: 10 U/L (ref 0–35)
Alkaline Phosphatase: 46 U/L (ref 39–117)
Indirect Bilirubin: 0.5 mg/dL (ref 0.0–0.9)
Total Protein: 6.8 g/dL (ref 6.0–8.3)

## 2012-10-22 LAB — BASIC METABOLIC PANEL
BUN: 7 mg/dL (ref 6–23)
Calcium: 9.6 mg/dL (ref 8.4–10.5)
Creat: 0.78 mg/dL (ref 0.50–1.10)

## 2012-10-23 ENCOUNTER — Encounter: Payer: Self-pay | Admitting: General Practice

## 2013-01-13 ENCOUNTER — Ambulatory Visit: Payer: BC Managed Care – PPO | Admitting: Family Medicine

## 2013-01-13 DIAGNOSIS — Z0289 Encounter for other administrative examinations: Secondary | ICD-10-CM

## 2013-01-30 ENCOUNTER — Encounter: Payer: Self-pay | Admitting: Internal Medicine

## 2013-01-30 ENCOUNTER — Ambulatory Visit (INDEPENDENT_AMBULATORY_CARE_PROVIDER_SITE_OTHER): Payer: BC Managed Care – PPO | Admitting: Internal Medicine

## 2013-01-30 ENCOUNTER — Telehealth: Payer: Self-pay | Admitting: Internal Medicine

## 2013-01-30 VITALS — BP 101/64 | HR 87 | Temp 97.8°F | Resp 16 | Wt 146.0 lb

## 2013-01-30 DIAGNOSIS — J019 Acute sinusitis, unspecified: Secondary | ICD-10-CM

## 2013-01-30 DIAGNOSIS — J029 Acute pharyngitis, unspecified: Secondary | ICD-10-CM

## 2013-01-30 MED ORDER — CLARITHROMYCIN ER 500 MG PO TB24: 1000.0000 mg | ORAL_TABLET | Freq: Every day | ORAL | Status: AC

## 2013-01-30 NOTE — Telephone Encounter (Signed)
Please advise 

## 2013-01-30 NOTE — Telephone Encounter (Signed)
Adero, will you please schedule patient for 12:30? Thanks, Shanda BumpsJessica

## 2013-01-30 NOTE — Patient Instructions (Addendum)
Plain Mucinex (NOT D) for thick secretions ;force NON dairy fluids .   Nasal cleansing in the shower as discussed with lather of mild shampoo.After 10 seconds wash off lather while  exhaling through nostrils. Make sure that all residual soap is removed to prevent irritation.  Nasacort AQ OTC  1 spray in each nostril twice a day as needed. Use the "crossover" technique into opposite nostril spraying toward opposite ear @ 45 degree angle, not straight up into nostril.  Use a Neti pot daily only  as needed for significant sinus congestion; going from open side to congested side . Plain Allegra (NOT D )  160 daily , Loratidine 10 mg , OR Zyrtec 10 mg @ bedtime  as needed for itchy eyes & sneezing. Zicam Melts or Zinc lozenges ; vitamin C 2000 mg daily; & Echinacea for 4-7 days.

## 2013-01-30 NOTE — Telephone Encounter (Signed)
12:30 today please

## 2013-01-30 NOTE — Progress Notes (Signed)
   Subjective:    Patient ID: Christy DouglasNatasha N Anderson, female    DOB: 04/27/1975, 38 y.o.   MRN: [id number]  HPI  This illness began 01/28/13 as malaise and a sore throat which has been progressive. She describes some pressure above baseline nose and head congestion. She's had yellow nasal discharge with some blood. Noted is been a partial benefit but she remains fatigued  12 /14/14  she had a severe sore throat with diffuse exudate. She apparently took "Amoxicillin " 875 mg for 10 days.     Review of Systems   She is not having significant frontal sinus or  Maxillary sinus pain. Also in otic pain or discharge  She does not describe sputum production, shortness breath, or wheezing.       Objective:   Physical Exam  General appearance:good health ;well nourished; no acute distress or increased work of breathing is present.  No  lymphadenopathy about the head, neck, or axilla noted.   Eyes: No conjunctival inflammation or lid edema is present.   Ears:  External ear exam shows no significant lesions or deformities.  Otoscopic examination reveals clear canals, tympanic membranes are intact bilaterally without bulging, retraction, inflammation or discharge.  Nose:  External nasal examination shows no deformity or inflammation. Nasal mucosa are pink and moist without lesions or exudates. No septal dislocation or deviation.No obstruction to airflow.   Oral exam: Dental hygiene is good; lips and gums are healthy appearing.There is mild oropharyngeal erythema ; no exudate noted.   Neck:  No deformities,  masses, or tenderness noted.      Heart:  Normal rate and regular rhythm. S1 and S2 normal without gallop, murmur, click, rub or other extra sounds.   Lungs:Chest clear to auscultation; no wheezes, rhonchi,rales ,or rubs present.No increased work of breathing.    Extremities:  No cyanosis, edema, or clubbing  noted    Skin: Warm & dry w/o jaundice or tenting.         Assessment & Plan:    #1 sinusitis See orders

## 2013-01-30 NOTE — Telephone Encounter (Signed)
Patient states she has sore throat, head congestion, chills and body aches, low-grade fever. We do not have any available appointments at any [facility] offices. She does not want to go to urgent care. [facility] on [address]

## 2013-01-30 NOTE — Telephone Encounter (Signed)
Called and spoke with patient and she can come today at 12:30 to see Dr. Alwyn RenHopper. JG//CMA

## 2014-05-21 ENCOUNTER — Encounter: Payer: Self-pay | Admitting: Internal Medicine
